# Patient Record
Sex: Female | Born: 1980
Health system: Southern US, Community
[De-identification: ages and names within clinical notes are randomized; demographics above are authoritative.]

## PROBLEM LIST (undated history)

## (undated) DIAGNOSIS — O1490 Unspecified pre-eclampsia, unspecified trimester: Secondary | ICD-10-CM

## (undated) DIAGNOSIS — H269 Unspecified cataract: Secondary | ICD-10-CM

## (undated) DIAGNOSIS — F419 Anxiety disorder, unspecified: Secondary | ICD-10-CM

## (undated) DIAGNOSIS — D649 Anemia, unspecified: Secondary | ICD-10-CM

## (undated) HISTORY — DX: Anemia, unspecified: D64.9

## (undated) HISTORY — DX: Unspecified cataract: H26.9

## (undated) HISTORY — DX: Anxiety disorder, unspecified: F41.9

## (undated) HISTORY — PX: CHOLECYSTECTOMY: SHX55

## (undated) HISTORY — DX: Unspecified pre-eclampsia, unspecified trimester: O14.90

---

## 2003-09-16 ENCOUNTER — Ambulatory Visit (HOSPITAL_COMMUNITY): Admission: RE | Admit: 2003-09-16 | Discharge: 2003-09-16 | Payer: Self-pay | Admitting: Obstetrics and Gynecology

## 2004-05-14 ENCOUNTER — Other Ambulatory Visit: Admission: RE | Admit: 2004-05-14 | Discharge: 2004-05-14 | Payer: Self-pay | Admitting: Obstetrics and Gynecology

## 2005-06-12 ENCOUNTER — Inpatient Hospital Stay (HOSPITAL_COMMUNITY): Admission: AD | Admit: 2005-06-12 | Discharge: 2005-06-15 | Payer: Self-pay | Admitting: Obstetrics and Gynecology

## 2005-06-20 ENCOUNTER — Inpatient Hospital Stay (HOSPITAL_COMMUNITY): Admission: AD | Admit: 2005-06-20 | Discharge: 2005-06-21 | Payer: Self-pay | Admitting: Obstetrics and Gynecology

## 2005-08-09 ENCOUNTER — Other Ambulatory Visit: Admission: RE | Admit: 2005-08-09 | Discharge: 2005-08-09 | Payer: Self-pay | Admitting: Obstetrics and Gynecology

## 2008-08-02 ENCOUNTER — Inpatient Hospital Stay (HOSPITAL_COMMUNITY): Admission: AD | Admit: 2008-08-02 | Discharge: 2008-08-02 | Payer: Self-pay | Admitting: Obstetrics and Gynecology

## 2008-08-17 ENCOUNTER — Inpatient Hospital Stay (HOSPITAL_COMMUNITY): Admission: RE | Admit: 2008-08-17 | Discharge: 2008-08-19 | Payer: Self-pay | Admitting: Obstetrics and Gynecology

## 2009-07-23 ENCOUNTER — Emergency Department (HOSPITAL_BASED_OUTPATIENT_CLINIC_OR_DEPARTMENT_OTHER): Admission: EM | Admit: 2009-07-23 | Discharge: 2009-07-23 | Payer: Self-pay | Admitting: Emergency Medicine

## 2009-07-23 ENCOUNTER — Ambulatory Visit: Payer: Self-pay | Admitting: Diagnostic Radiology

## 2010-05-20 LAB — CBC
HCT: 31.3 % — ABNORMAL LOW (ref 36.0–46.0)
HCT: 32.4 % — ABNORMAL LOW (ref 36.0–46.0)
Hemoglobin: 10.6 g/dL — ABNORMAL LOW (ref 12.0–15.0)
MCV: 89 fL (ref 78.0–100.0)
Platelets: 177 10*3/uL (ref 150–400)
RBC: 3.51 MIL/uL — ABNORMAL LOW (ref 3.87–5.11)
RBC: 3.69 MIL/uL — ABNORMAL LOW (ref 3.87–5.11)
RDW: 14 % (ref 11.5–15.5)
RDW: 14.1 % (ref 11.5–15.5)
WBC: 12.1 10*3/uL — ABNORMAL HIGH (ref 4.0–10.5)

## 2010-05-20 LAB — RPR: RPR Ser Ql: NONREACTIVE

## 2010-06-26 NOTE — Discharge Summary (Signed)
NAMEROGENE, METH           ACCOUNT NO.:  [id number]   MEDICAL RECORD NO.:  [id number]          PATIENT TYPE:  INP   LOCATION:  9139                          FACILITY:  [facility]   PHYSICIAN:  Huel Cote, M.D. DATE OF BIRTH:  [birth year]   DATE OF ADMISSION:  08/17/2008  DATE OF DISCHARGE:  08/19/2008                               DISCHARGE SUMMARY   DISCHARGE DIAGNOSES:  1. A 39 weeks, delivered.  2. Status post normal spontaneous vaginal delivery.   DISCHARGE MEDICATIONS:  Motrin 600 mg p.o. every 6 hours.   DISCHARGE FOLLOWUP:  The patient is to follow up in 6 weeks for her  routine postpartum exam.   HOSPITAL COURSE:  The patient is a 30 year old, G4, P1-0-2-1, who was  admitted at 55 weeks' gestation for possible LGA infant with a favorable  cervix for induction.  She had polyhydramnios earlier that resolved.  Prenatal care had been otherwise uneventful except for a positive GBS  culture.   Prenatal labs are as follows:  A positive, antibody negative, RPR  nonreactive, rubella equivocal, hepatitis B surface antigen negative,  HIV negative, GC negative, Chlamydia negative, group B strep positive,  one-hour Glucola 79.   PAST OBSTETRICAL HISTORY:  Two spontaneous miscarriages in May 2007.  She had a vacuum-assisted vaginal delivery of a 6-pound 15-ounce infant.   PAST GYN HISTORY:  None.   PAST SURGICAL HISTORY:  A wrist surgery for a fracture in 97.   PAST MEDICAL HISTORY:  None.   No allergies.   She is afebrile with stable vital signs.  Fetal heart rate was reactive  on admission.  Cervix was 52 and a minus 2 station.  Estimated fetal  weight was 8.5 pounds.  She was placed on penicillin for her group B  strep status and Pitocin, and several hours later had rupture of  membranes performed.  She continued to progress well, reached complete  dilation, and pushed great with a normal spontaneous vaginal delivery of  a vigorous female infant over a small  second-degree laceration.  Apgars  were 8 and 9, weight was 8 pounds 3 ounces.  There was a nuchal x1  reduced over head.  Small second-degree laceration was repaired with 2-0  Vicryl for hemostasis.  She was admitted for routine postpartum care.  Discharge hemoglobin was 10.6.  She did well, tolerated her pain with  p.o. Motrin, and was stable for discharge home on postpartum day #2.      Huel Cote, M.D.  Electronically Signed     KR/MEDQ  D:  08/19/2008  T:  08/19/2008  Job:  [id number]

## 2010-06-29 NOTE — Discharge Summary (Signed)
Chelsea Andersen, BRICKMAN           ACCOUNT NO.:  [id number]   MEDICAL RECORD NO.:  [id number]          PATIENT TYPE:  INP   LOCATION:  9314                          [facility]ILITY:  [facility]   PHYSICIAN:  Malachi Pro. Ambrose Mantle, M.D. 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  Prenatal care  complicated by spontaneous abortion x2 previously and fetal right  pyelectasis followed with serial ultrasounds and stable.  Blood group and  type A+.  Negative antibody.  RPR nonreactive.  Rubella non-immune.  Hepatitis B surface antigen negative.  HIV negative.  GC and chlamydia  negative.  Group B strep negative.  One-hour Glucola 101.   GYN HISTORY:  Negative.   MEDICAL HISTORY:  Negative.   SURGICAL HISTORY:  Left broken wrist in 1997.   No known allergies.  No medications.  On admission the patient was afebrile  with normal vital signs.  Fetal heart rate was reactive.  Heart was normal  sounds with no murmurs.  Lungs were clear.  Abdomen soft, nontender.  Cervix  1+ cm, 50%, vertex at a -2.  Patient was begun on Pitocin.  She began to get  uncomfortable at 1 p.m.  She was 2-3 cm, 80%.  Intrauterine pressure  catheter was placed as the Pitocin was at 18 milliunits a minute.  At 8 p.m.  the patient was comfortable with an epidural.  Cervix 3-4 cm, 80-90%.  An  intrauterine pressure catheter was replaced to ensure functioning well.  By  10 p.m. the cervix was 4-5 cm, 80%.  Dr. Senaida Ores advised her two more  hours to see if there was change.  By 1 a.m. the patient was feeling  increased pressure.  She was called to see  the patient because there had  been no change and Dr. Senaida Ores examined her and found her to be  completely dilated at a +1 station.  The patient pushed for approximately  two hours with descent of the vertex to +2 to +3 in the LOA position.  Variable decelerations were occurring with pushing with some delay in  recovery and fetal tachycardia was noted up to 180 with maternal temperature  of 101.5.  Patient was counseled regarding need to expedite delivery and she  agreed to a vacuum-assisted delivery.  The Kiwi self-held vacuum was placed  over a posterior fornix and the vertex delivered to crowning with series of  three pulls.  Patient then pushed out the remainder of the head and the  patient delivered a living female infant 6 pounds 15 ounces with Apgars of 7  at one and 9 at five minutes.  Placenta delivered spontaneously.  Small  second-degree laceration was noted and repaired with 2-0 Vicryl.  Cervix and  rectum  were intact.  Blood loss about 400 mL.  Baby did quite well.  Postpartum the patient did well and was discharged on the second postpartum  day.  Initial hemoglobin 11.9, hematocrit 35.2, white count 12,600, platelet  count 260,000.  Follow-up hemoglobin 10.5.  RPR was nonreactive.   FINAL DIAGNOSES:  1.  Intrauterine pregnancy 39+ weeks delivered left occiput anterior.  2.  Prolonged second stage of labor.   OPERATION:  1.  Vacuum-assisted vaginal delivery.  2.  Second-degree perineal laceration repair.   FINAL CONDITION:  Improved.   INSTRUCTIONS:  Our regular discharge instruction booklet.  Patient is  advised to use Percocet 5/325 24 tablets one every four to six hours as  needed for pain, to get the rubella vaccine prior to discharge, and return  in six weeks for follow-up examination.      Malachi Pro. Ambrose Mantle, M.D.  Electronically Signed     TFH/MEDQ  D:  06/15/2005  T:  06/17/2005  Job:  [id number]

## 2014-05-16 ENCOUNTER — Ambulatory Visit
Admission: RE | Admit: 2014-05-16 | Discharge: 2014-05-16 | Disposition: A | Discharge status: Home / Self Care | Payer: BLUE CROSS/BLUE SHIELD | Source: Ambulatory Visit | Attending: Family Medicine | Admitting: Family Medicine

## 2014-05-16 ENCOUNTER — Encounter (INDEPENDENT_AMBULATORY_CARE_PROVIDER_SITE_OTHER): Payer: Self-pay

## 2014-05-16 ENCOUNTER — Other Ambulatory Visit: Payer: Self-pay | Admitting: Family Medicine

## 2014-05-16 DIAGNOSIS — M25511 Pain in right shoulder: Secondary | ICD-10-CM

## 2014-07-01 ENCOUNTER — Other Ambulatory Visit: Payer: Self-pay | Admitting: Family Medicine

## 2014-07-01 DIAGNOSIS — M25511 Pain in right shoulder: Secondary | ICD-10-CM

## 2014-07-01 DIAGNOSIS — M5410 Radiculopathy, site unspecified: Secondary | ICD-10-CM

## 2014-07-17 ENCOUNTER — Ambulatory Visit
Admission: RE | Admit: 2014-07-17 | Discharge: 2014-07-17 | Disposition: A | Discharge status: Home / Self Care | Payer: Self-pay | Source: Ambulatory Visit | Attending: Family Medicine | Admitting: Family Medicine

## 2014-07-17 DIAGNOSIS — M25511 Pain in right shoulder: Secondary | ICD-10-CM

## 2014-07-17 DIAGNOSIS — M5410 Radiculopathy, site unspecified: Secondary | ICD-10-CM

## 2014-08-03 ENCOUNTER — Ambulatory Visit (INDEPENDENT_AMBULATORY_CARE_PROVIDER_SITE_OTHER): Payer: BLUE CROSS/BLUE SHIELD | Admitting: Diagnostic Neuroimaging

## 2014-08-03 ENCOUNTER — Encounter: Payer: Self-pay | Admitting: *Deleted

## 2014-08-03 VITALS — BP 111/77 | HR 71 | Ht 64.0 in | Wt 154.4 lb

## 2014-08-03 DIAGNOSIS — T148XXA Other injury of unspecified body region, initial encounter: Secondary | ICD-10-CM

## 2014-08-03 DIAGNOSIS — M7918 Myalgia, other site: Secondary | ICD-10-CM

## 2014-08-03 DIAGNOSIS — T148 Other injury of unspecified body region: Secondary | ICD-10-CM

## 2014-08-03 DIAGNOSIS — M792 Neuralgia and neuritis, unspecified: Secondary | ICD-10-CM | POA: Diagnosis not present

## 2014-08-03 DIAGNOSIS — M791 Myalgia: Secondary | ICD-10-CM | POA: Diagnosis not present
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
## 2015-07-03 DIAGNOSIS — Z30430 Encounter for insertion of intrauterine contraceptive device: Secondary | ICD-10-CM | POA: Diagnosis not present

## 2015-07-11 ENCOUNTER — Other Ambulatory Visit (HOSPITAL_BASED_OUTPATIENT_CLINIC_OR_DEPARTMENT_OTHER): Payer: Self-pay | Admitting: Family Medicine

## 2015-07-11 DIAGNOSIS — R1084 Generalized abdominal pain: Secondary | ICD-10-CM

## 2015-07-11 DIAGNOSIS — R1011 Right upper quadrant pain: Secondary | ICD-10-CM | POA: Diagnosis not present

## 2015-07-12 ENCOUNTER — Ambulatory Visit (HOSPITAL_BASED_OUTPATIENT_CLINIC_OR_DEPARTMENT_OTHER)
Admission: RE | Admit: 2015-07-12 | Discharge: 2015-07-12 | Disposition: A | Discharge status: Home / Self Care | Payer: BLUE CROSS/BLUE SHIELD | Source: Ambulatory Visit | Attending: Family Medicine | Admitting: Family Medicine

## 2015-07-12 DIAGNOSIS — R1084 Generalized abdominal pain: Secondary | ICD-10-CM | POA: Diagnosis not present

## 2015-07-12 DIAGNOSIS — K8020 Calculus of gallbladder without cholecystitis without obstruction: Secondary | ICD-10-CM | POA: Insufficient documentation

## 2015-07-14 DIAGNOSIS — H269 Unspecified cataract: Secondary | ICD-10-CM | POA: Insufficient documentation

## 2015-07-17 DIAGNOSIS — K8001 Calculus of gallbladder with acute cholecystitis with obstruction: Secondary | ICD-10-CM | POA: Diagnosis not present

## 2015-07-17 DIAGNOSIS — Z6826 Body mass index (BMI) 26.0-26.9, adult: Secondary | ICD-10-CM | POA: Diagnosis not present

## 2015-07-26 DIAGNOSIS — Z30431 Encounter for routine checking of intrauterine contraceptive device: Secondary | ICD-10-CM | POA: Diagnosis not present

## 2015-07-28 DIAGNOSIS — K8010 Calculus of gallbladder with chronic cholecystitis without obstruction: Secondary | ICD-10-CM | POA: Diagnosis not present

## 2015-07-28 DIAGNOSIS — K8001 Calculus of gallbladder with acute cholecystitis with obstruction: Secondary | ICD-10-CM | POA: Diagnosis not present

## 2015-07-28 DIAGNOSIS — K8020 Calculus of gallbladder without cholecystitis without obstruction: Secondary | ICD-10-CM | POA: Diagnosis not present

## 2015-12-16 DIAGNOSIS — N3001 Acute cystitis with hematuria: Secondary | ICD-10-CM | POA: Diagnosis not present

## 2016-01-12 DIAGNOSIS — L299 Pruritus, unspecified: Secondary | ICD-10-CM | POA: Diagnosis not present

## 2016-01-12 DIAGNOSIS — L2084 Intrinsic (allergic) eczema: Secondary | ICD-10-CM | POA: Diagnosis not present

## 2016-01-12 DIAGNOSIS — Z809 Family history of malignant neoplasm, unspecified: Secondary | ICD-10-CM | POA: Diagnosis not present

## 2016-01-30 DIAGNOSIS — J3089 Other allergic rhinitis: Secondary | ICD-10-CM | POA: Diagnosis not present

## 2016-01-30 DIAGNOSIS — H1013 Acute atopic conjunctivitis, bilateral: Secondary | ICD-10-CM | POA: Diagnosis not present

## 2016-01-30 DIAGNOSIS — J3081 Allergic rhinitis due to animal (cat) (dog) hair and dander: Secondary | ICD-10-CM | POA: Diagnosis not present

## 2016-02-01 DIAGNOSIS — J3081 Allergic rhinitis due to animal (cat) (dog) hair and dander: Secondary | ICD-10-CM | POA: Insufficient documentation

## 2016-03-08 DIAGNOSIS — L281 Prurigo nodularis: Secondary | ICD-10-CM | POA: Diagnosis not present

## 2016-03-08 DIAGNOSIS — Z809 Family history of malignant neoplasm, unspecified: Secondary | ICD-10-CM | POA: Diagnosis not present

## 2016-03-08 DIAGNOSIS — L578 Other skin changes due to chronic exposure to nonionizing radiation: Secondary | ICD-10-CM | POA: Diagnosis not present

## 2016-04-10 DIAGNOSIS — E663 Overweight: Secondary | ICD-10-CM | POA: Diagnosis not present

## 2016-04-10 DIAGNOSIS — Z713 Dietary counseling and surveillance: Secondary | ICD-10-CM | POA: Diagnosis not present
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
## 2016-07-10 DIAGNOSIS — Z713 Dietary counseling and surveillance: Secondary | ICD-10-CM | POA: Diagnosis not present

## 2016-11-07 DIAGNOSIS — R5383 Other fatigue: Secondary | ICD-10-CM | POA: Diagnosis not present

## 2016-11-07 DIAGNOSIS — Z130 Encounter for screening for diseases of the blood and blood-forming organs and certain disorders involving the immune mechanism: Secondary | ICD-10-CM | POA: Diagnosis not present

## 2016-11-07 DIAGNOSIS — Z6829 Body mass index (BMI) 29.0-29.9, adult: Secondary | ICD-10-CM | POA: Diagnosis not present

## 2016-11-07 DIAGNOSIS — Z01419 Encounter for gynecological examination (general) (routine) without abnormal findings: Secondary | ICD-10-CM | POA: Diagnosis not present

## 2016-11-07 DIAGNOSIS — Z1389 Encounter for screening for other disorder: Secondary | ICD-10-CM | POA: Diagnosis not present

## 2016-11-19 DIAGNOSIS — F411 Generalized anxiety disorder: Secondary | ICD-10-CM | POA: Diagnosis not present

## 2016-11-19 DIAGNOSIS — F410 Panic disorder [episodic paroxysmal anxiety] without agoraphobia: Secondary | ICD-10-CM | POA: Diagnosis not present

## 2016-11-26 DIAGNOSIS — F410 Panic disorder [episodic paroxysmal anxiety] without agoraphobia: Secondary | ICD-10-CM | POA: Diagnosis not present

## 2016-12-09 DIAGNOSIS — F410 Panic disorder [episodic paroxysmal anxiety] without agoraphobia: Secondary | ICD-10-CM | POA: Diagnosis not present

## 2016-12-13 DIAGNOSIS — F411 Generalized anxiety disorder: Secondary | ICD-10-CM | POA: Diagnosis not present

## 2016-12-13 DIAGNOSIS — F410 Panic disorder [episodic paroxysmal anxiety] without agoraphobia: Secondary | ICD-10-CM | POA: Diagnosis not present

## 2016-12-23 DIAGNOSIS — F410 Panic disorder [episodic paroxysmal anxiety] without agoraphobia: Secondary | ICD-10-CM | POA: Diagnosis not present

## 2017-01-27 DIAGNOSIS — F410 Panic disorder [episodic paroxysmal anxiety] without agoraphobia: Secondary | ICD-10-CM | POA: Diagnosis not present

## 2017-02-19 DIAGNOSIS — F410 Panic disorder [episodic paroxysmal anxiety] without agoraphobia: Secondary | ICD-10-CM | POA: Diagnosis not present

## 2017-03-03 DIAGNOSIS — F410 Panic disorder [episodic paroxysmal anxiety] without agoraphobia: Secondary | ICD-10-CM | POA: Diagnosis not present

## 2017-03-24 DIAGNOSIS — F410 Panic disorder [episodic paroxysmal anxiety] without agoraphobia: Secondary | ICD-10-CM | POA: Diagnosis not present

## 2017-04-07 DIAGNOSIS — F410 Panic disorder [episodic paroxysmal anxiety] without agoraphobia: Secondary | ICD-10-CM | POA: Diagnosis not present

## 2018-05-09 IMAGING — US US ABDOMEN COMPLETE
1 series · 14 of 25 positions shown · non-contrast
Comparison: Ultrasound 09/16/2003.

CLINICAL DATA: Right upper quadrant pain .

EXAM:
ABDOMEN ULTRASOUND COMPLETE

[Series 1: us abdomen complete · 0.15mm/px · 14 of 81 slices shown]
[im 1/81]
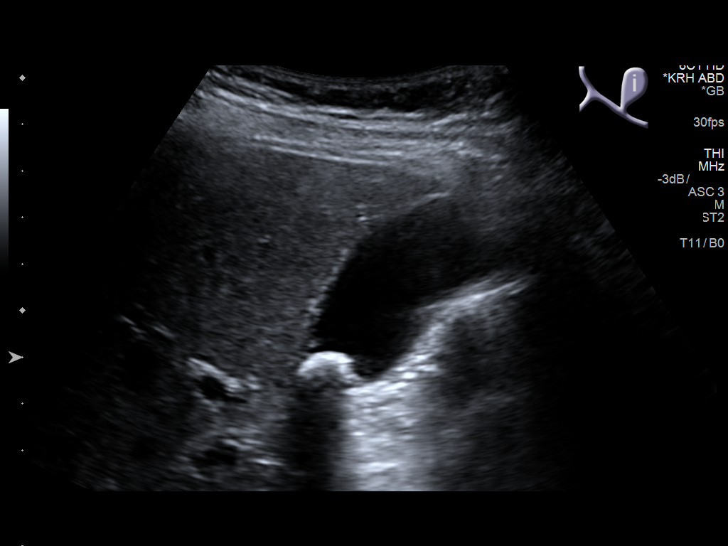
[im 7/81]
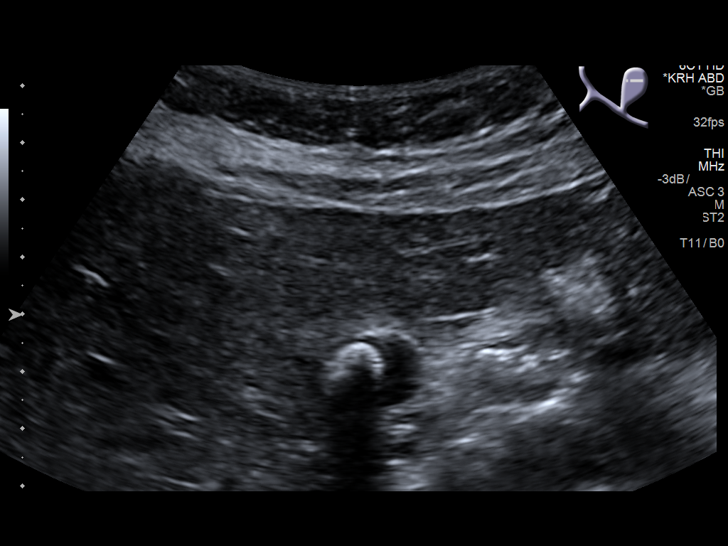
[im 14/81]
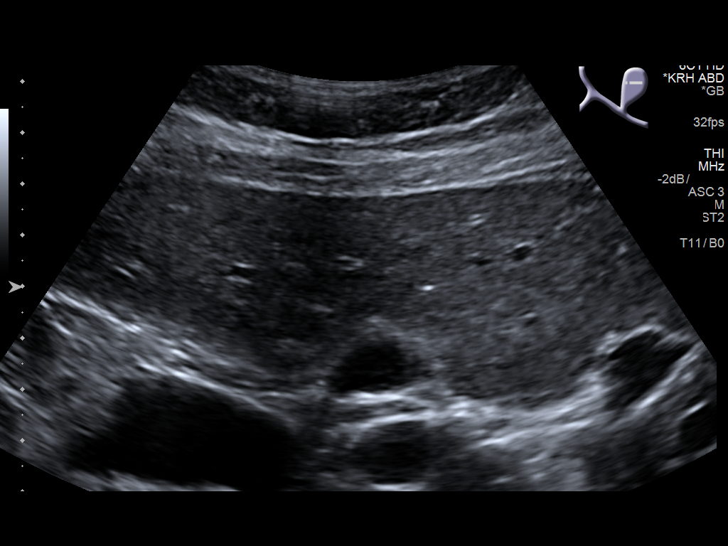
[im 21/81]
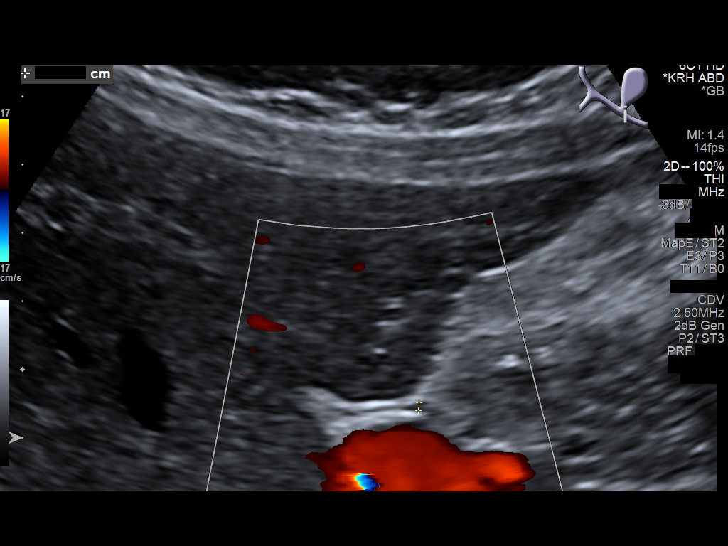
[im 27/81]
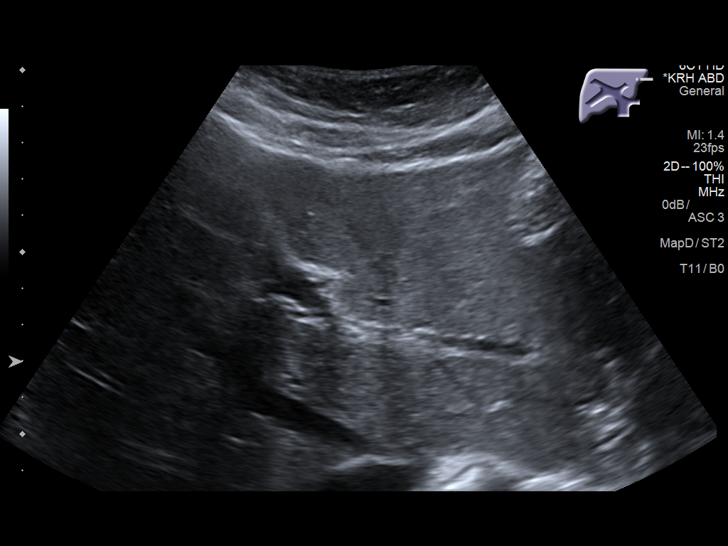
[im 31/81]
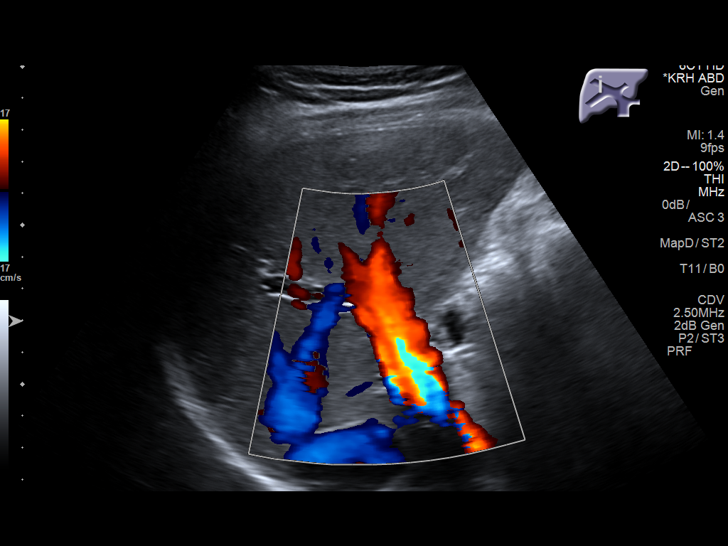
[im 37/81]
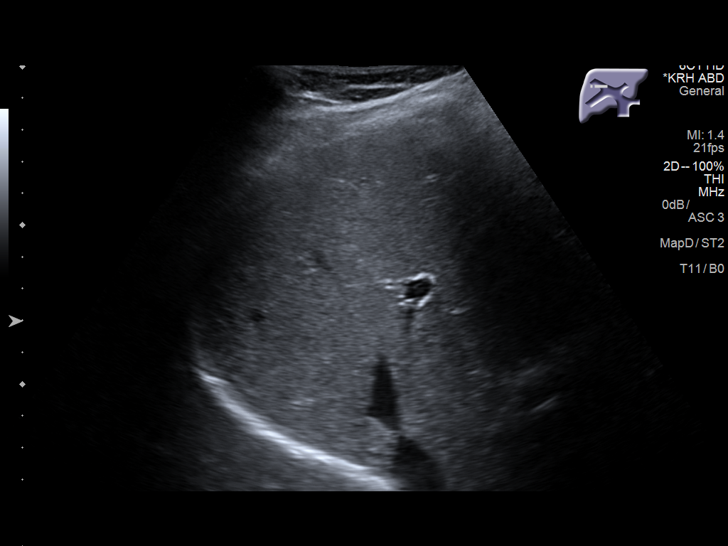
[im 44/81]
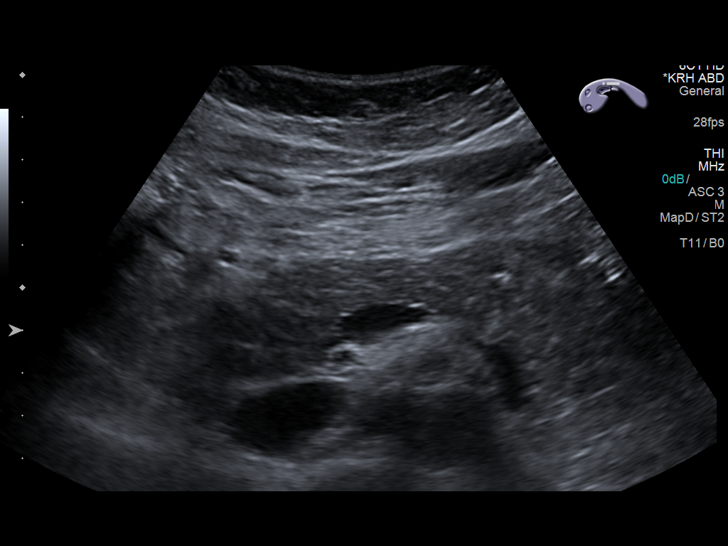
[im 51/81]
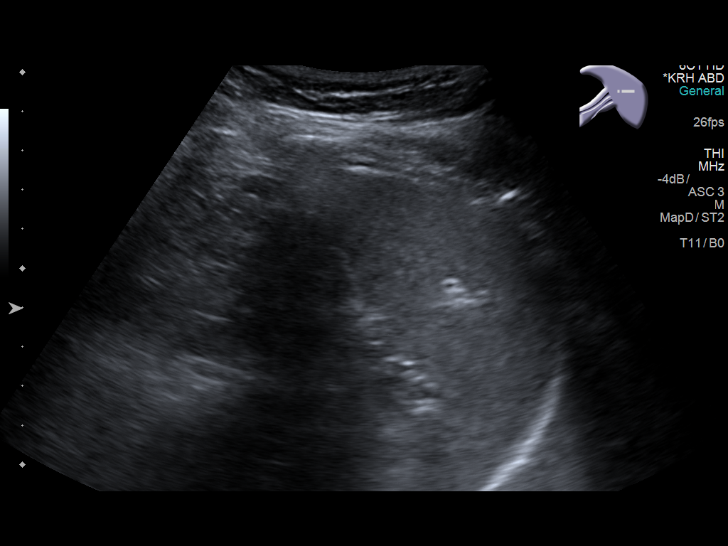
[im 54/81]
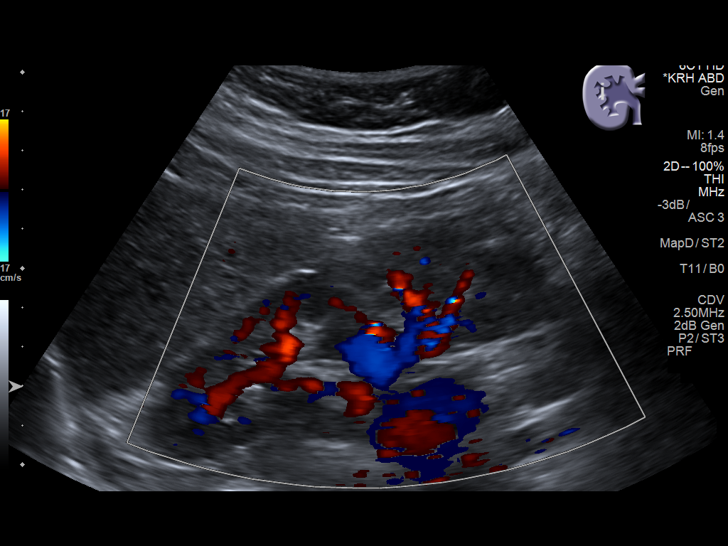
[im 61/81]
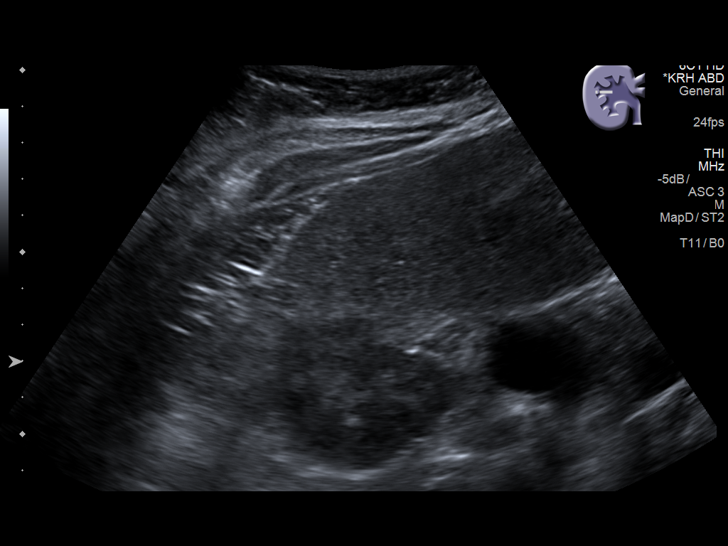
[im 67/81]
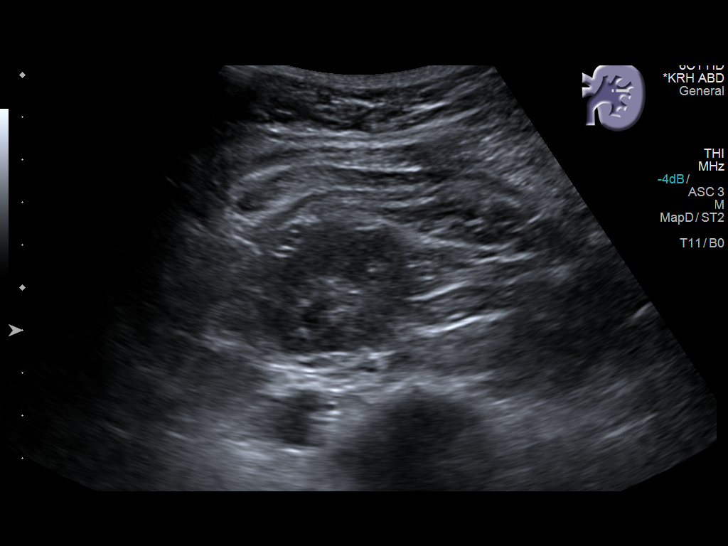
[im 74/81]
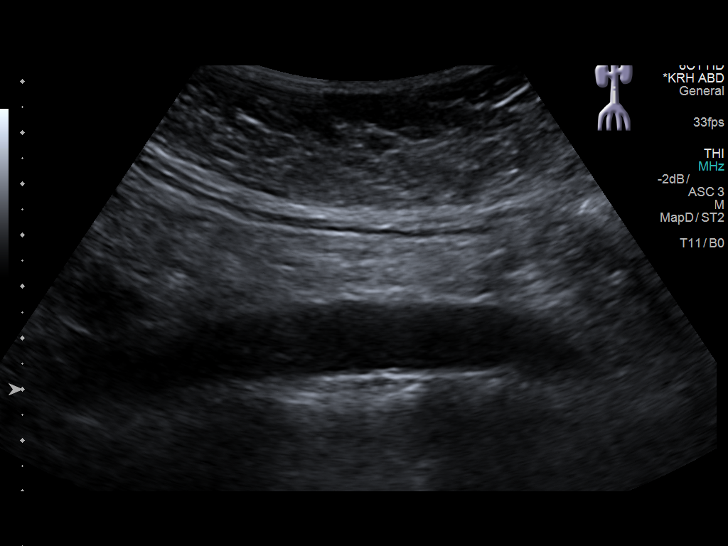
[im 81/81]
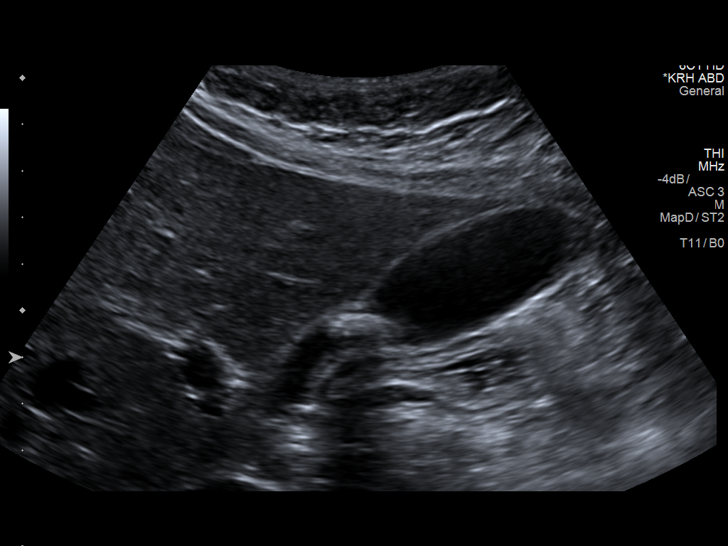

[14 of 25 positions shown; findings below may reference images not displayed]

FINDINGS: Gallbladder: 1.7 cm in mobile gallstone in the neck of the
gallbladder. Gallbladder wall thickness normal at 2 mm. Positive
Murphy sign.

Common bile duct: Diameter: 1.0 mm

Liver: No focal lesion identified. Within normal limits in
parenchymal echogenicity.

IVC: No abnormality visualized.

Pancreas: Visualized portion unremarkable.

Spleen: Size and appearance within normal limits.

Right Kidney: Length: 12.0 cm. Echogenicity within normal limits. No
mass or hydronephrosis visualized.

Left Kidney: Length: 12.7 cm. Echogenicity within normal limits. No
mass or hydronephrosis visualized.

Abdominal aorta: No aneurysm visualized.

Other findings: None.
IMPRESSION: 1.7 cm mobile gallstone in the neck of the gallbladder. Gallbladder
wall thickness normal. No pathologic pericholecystic fluid
collections noted. Murphy sign was positive. No biliary distention.

## 2019-01-16 ENCOUNTER — Emergency Department (HOSPITAL_BASED_OUTPATIENT_CLINIC_OR_DEPARTMENT_OTHER)
Admission: EM | Admit: 2019-01-16 | Discharge: 2019-01-17 | Disposition: A | Discharge status: Home / Self Care | Payer: BC Managed Care – PPO | Attending: Emergency Medicine | Admitting: Emergency Medicine

## 2019-01-16 ENCOUNTER — Encounter (HOSPITAL_BASED_OUTPATIENT_CLINIC_OR_DEPARTMENT_OTHER): Payer: Self-pay | Admitting: Emergency Medicine

## 2019-01-16 ENCOUNTER — Other Ambulatory Visit: Payer: Self-pay

## 2019-01-16 DIAGNOSIS — T7840XA Allergy, unspecified, initial encounter: Secondary | ICD-10-CM | POA: Diagnosis not present

## 2019-01-16 DIAGNOSIS — R600 Localized edema: Secondary | ICD-10-CM | POA: Diagnosis present

## 2019-01-16 DIAGNOSIS — Z87891 Personal history of nicotine dependence: Secondary | ICD-10-CM | POA: Diagnosis not present

## 2019-01-16 MED ORDER — DIPHENHYDRAMINE HCL 50 MG/ML IJ SOLN
25.0000 mg | Freq: Once | INTRAMUSCULAR | Status: AC
  Administered 2019-01-16: 25 mg via INTRAVENOUS
  Filled 2019-01-16: qty 1

## 2019-01-16 MED ORDER — FLUORESCEIN SODIUM 1 MG OP STRP
ORAL_STRIP | OPHTHALMIC | Status: AC
  Filled 2019-01-16: qty 1

## 2019-01-16 MED ORDER — TETRACAINE HCL 0.5 % OP SOLN
OPHTHALMIC | Status: AC
  Filled 2019-01-16: qty 4

## 2019-01-16 MED ORDER — DEXAMETHASONE SODIUM PHOSPHATE 10 MG/ML IJ SOLN
10.0000 mg | Freq: Once | INTRAMUSCULAR | Status: AC
  Administered 2019-01-16: 10 mg via INTRAVENOUS
  Filled 2019-01-16: qty 1

## 2019-01-16 MED ORDER — OXYMETAZOLINE HCL 0.05 % NA SOLN
1.0000 | Freq: Once | NASAL | Status: AC
  Administered 2019-01-16: 1 via NASAL
  Filled 2019-01-16: qty 30

## 2019-01-16 NOTE — ED Triage Notes (Signed)
Patient states that she started to have nasal congestion and right eyes swelling with some watery eyes about 1 hour ago  - denies any noted allergies. Swelling noted to her right eye. Patient denies any SOB and / or trouble swallowing

## 2019-01-16 NOTE — ED Notes (Signed)
Pt states symptoms are improving.
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
## 2019-01-17 MED ORDER — EPINEPHRINE 0.3 MG/0.3ML IJ SOAJ: 0.3000 mg | INTRAMUSCULAR | 0 refills | Status: AC | PRN

## 2019-01-17 NOTE — ED Notes (Signed)
Removed 20G IV in R AC, catheter intact, gauze dressing applied.

## 2019-02-02 DIAGNOSIS — F411 Generalized anxiety disorder: Secondary | ICD-10-CM | POA: Diagnosis not present

## 2019-02-02 DIAGNOSIS — T7840XD Allergy, unspecified, subsequent encounter: Secondary | ICD-10-CM | POA: Diagnosis not present

## 2019-02-02 DIAGNOSIS — F410 Panic disorder [episodic paroxysmal anxiety] without agoraphobia: Secondary | ICD-10-CM | POA: Diagnosis not present

## 2019-04-12 DIAGNOSIS — Z130 Encounter for screening for diseases of the blood and blood-forming organs and certain disorders involving the immune mechanism: Secondary | ICD-10-CM | POA: Diagnosis not present

## 2019-04-12 DIAGNOSIS — Z6832 Body mass index (BMI) 32.0-32.9, adult: Secondary | ICD-10-CM | POA: Diagnosis not present

## 2019-04-12 DIAGNOSIS — Z124 Encounter for screening for malignant neoplasm of cervix: Secondary | ICD-10-CM | POA: Diagnosis not present

## 2019-04-12 DIAGNOSIS — Z1151 Encounter for screening for human papillomavirus (HPV): Secondary | ICD-10-CM | POA: Diagnosis not present

## 2019-04-12 DIAGNOSIS — Z01419 Encounter for gynecological examination (general) (routine) without abnormal findings: Secondary | ICD-10-CM | POA: Diagnosis not present

## 2019-04-12 DIAGNOSIS — Z1231 Encounter for screening mammogram for malignant neoplasm of breast: Secondary | ICD-10-CM | POA: Diagnosis not present

## 2020-12-06 DIAGNOSIS — F411 Generalized anxiety disorder: Secondary | ICD-10-CM | POA: Diagnosis not present

## 2021-02-27 ENCOUNTER — Other Ambulatory Visit (HOSPITAL_BASED_OUTPATIENT_CLINIC_OR_DEPARTMENT_OTHER): Payer: Self-pay

## 2021-02-27 MED ORDER — FLUOXETINE HCL 10 MG PO CAPS
ORAL_CAPSULE | ORAL | 0 refills | Status: DC
  Filled 2021-02-27 – 2021-03-27 (×2): qty 90, 90d supply, fill #0

## 2021-03-07 ENCOUNTER — Other Ambulatory Visit (HOSPITAL_BASED_OUTPATIENT_CLINIC_OR_DEPARTMENT_OTHER): Payer: Self-pay

## 2021-03-27 ENCOUNTER — Other Ambulatory Visit (HOSPITAL_BASED_OUTPATIENT_CLINIC_OR_DEPARTMENT_OTHER): Payer: Self-pay

## 2021-10-08 DIAGNOSIS — Z30431 Encounter for routine checking of intrauterine contraceptive device: Secondary | ICD-10-CM | POA: Diagnosis not present

## 2021-10-08 DIAGNOSIS — Z01419 Encounter for gynecological examination (general) (routine) without abnormal findings: Secondary | ICD-10-CM | POA: Diagnosis not present

## 2021-10-08 DIAGNOSIS — Z1231 Encounter for screening mammogram for malignant neoplasm of breast: Secondary | ICD-10-CM | POA: Diagnosis not present

## 2021-10-08 DIAGNOSIS — Z130 Encounter for screening for diseases of the blood and blood-forming organs and certain disorders involving the immune mechanism: Secondary | ICD-10-CM | POA: Diagnosis not present

## 2021-10-08 DIAGNOSIS — Z1389 Encounter for screening for other disorder: Secondary | ICD-10-CM | POA: Diagnosis not present

## 2022-03-20 ENCOUNTER — Ambulatory Visit: Payer: 59 | Admitting: Family Medicine

## 2022-03-27 ENCOUNTER — Ambulatory Visit: Payer: 59 | Admitting: Family Medicine

## 2022-03-27 ENCOUNTER — Encounter: Payer: Self-pay | Admitting: Family Medicine

## 2022-03-27 VITALS — BP 129/70 | HR 109 | Temp 98.2°F | Resp 16 | Ht 65.0 in | Wt 188.0 lb

## 2022-03-27 DIAGNOSIS — R635 Abnormal weight gain: Secondary | ICD-10-CM

## 2022-03-27 DIAGNOSIS — F419 Anxiety disorder, unspecified: Secondary | ICD-10-CM

## 2022-03-27 DIAGNOSIS — D649 Anemia, unspecified: Secondary | ICD-10-CM | POA: Insufficient documentation

## 2022-03-27 DIAGNOSIS — Z975 Presence of (intrauterine) contraceptive device: Secondary | ICD-10-CM | POA: Diagnosis not present

## 2022-03-27 DIAGNOSIS — Z6831 Body mass index (BMI) 31.0-31.9, adult: Secondary | ICD-10-CM

## 2022-03-27 NOTE — Patient Instructions (Signed)
Thank you for choosing [facility] at [facility] for your Primary Care needs. I am excited for the opportunity to partner with you to meet your health care goals. It was a pleasure meeting you today!  Information on diet, exercise, and health maintenance recommendations are listed below. This is information to help you be sure you are on track for optimal health and monitoring.   Please look over this and let us know if you have any questions or if you have completed any of the health maintenance outside of [place] so that we can be sure your records are up to date.  ___________________________________________________________  MyChart:  For all urgent or time sensitive needs we ask that you please call the office to avoid delays. Our number is [phone]. MyChart is not constantly monitored and due to the large volume of messages a day, replies may take up to 72 business hours.  MyChart Policy: MyChart allows for you to see your visit notes, after visit summary, provider recommendations, lab and tests results, make an appointment, request refills, and contact your provider or the office for non-urgent questions or concerns. Providers are seeing patients during normal business hours and do not have built in time to review MyChart messages.  We ask that you allow a minimum of 3 business days for responses to [employer]. For this reason, please do not send urgent requests through [place]. Please call the office at [phone]. New and ongoing conditions may require a visit. We have virtual and in-person visits available for your convenience.  Complex MyChart concerns may require a visit. Your provider may request you schedule a virtual or in-person visit to ensure we are providing the best care possible. MyChart messages sent after 11:00 AM on Friday will not be received by the provider until Monday morning.    Lab and Test Results: You will receive your lab and test  results on MyChart as soon as they are completed and results have been sent by the lab or testing facility. Due to this service, you will receive your results BEFORE your provider.  I review lab and test results each morning prior to seeing patients. Some results require collaboration with other providers to ensure you are receiving the most appropriate care. For this reason, we ask that you please allow a minimum of 3-5 business days from the time that ALL results have been received for your provider to receive and review lab and test results and contact you about these.  Most lab and test result comments from the provider will be sent through [facility]. Your provider may recommend changes to the plan of care, follow-up visits, repeat testing, ask questions, or request an office visit to discuss these results. You may reply directly to this message or call the office to provide information for the provider or set up an appointment. In some instances, you will be called with test results and recommendations. Please let us know if this is preferred and we will make note of this in your chart to provide this for you.    If you have not heard a response to your lab or test results in 5 business days from all results returning to [place], please call the office to let us know. We ask that you please avoid calling prior to this time unless there is an emergent concern. Due to high call volumes, this can delay the resulting process.  After Hours: For all non-emergency after hours needs, please  call the office at [phone] and select the option to reach the on-call  service. On-call services are shared between multiple [place] offices and therefore it will not be possible to speak directly with your provider. On-call providers may provide medical advice and recommendations, but are unable to provide refills for maintenance medications.  For all emergency or urgent medical needs after normal business hours, we  recommend that you seek care at the closest Urgent Care or Emergency Department to ensure appropriate treatment in a timely manner.  [facility] has a 24 hour emergency room located on the ground floor for your convenience.   Urgent Concerns During the Business Day Providers are seeing patients from 8AM to [place] with a busy schedule and are most often not able to respond to non-urgent calls until the end of the day or the next business day. If you should have URGENT concerns during the day, please call and speak to the nurse or schedule a same day appointment so that we can address your concern without delay.   Thank you, again, for choosing me as your health care partner. I appreciate your trust and look forward to learning more about you!   Purcell Nails Olevia Bowens, DNP, FNP-C  ___________________________________________________________  Health Maintenance Recommendations Screening Testing Mammogram Every 1-2 years based on history and risk factors Starting at age 22 Pap Smear Ages 21-39 every 3 years Ages 86-65 every 5 years with HPV testing More frequent testing may be required based on results and history Colon Cancer Screening Every 1-10 years based on test performed, risk factors, and history Starting at age 24 Bone Density Screening Every 2-10 years based on history Starting at age 26 for women Recommendations for men differ based on medication usage, history, and risk factors AAA Screening One time ultrasound Men 69-77 years old who have ever smoked Lung Cancer Screening Low Dose Lung CT every 12 months Age 79-80 years with a 20 pack-year smoking history who still smoke or who have quit within the last 15 years  Screening Labs Routine  Labs: Complete Blood Count (CBC), Complete Metabolic Panel (CMP), Cholesterol (Lipid Panel) Every 6-12 months based on history and medications May be recommended more frequently based on current conditions or previous results Hemoglobin  A1c Lab Every 3-12 months based on history and previous results Starting at age 60 or earlier with diagnosis of diabetes, high cholesterol, BMI >26, and/or risk factors Frequent monitoring for patients with diabetes to ensure blood sugar control Thyroid Panel  Every 6 months based on history, symptoms, and risk factors May be repeated more often if on medication HIV One time testing for all patients 60 and older May be repeated more frequently for patients with increased risk factors or exposure Hepatitis C One time testing for all patients 52 and older May be repeated more frequently for patients with increased risk factors or exposure Gonorrhea, Chlamydia Every 12 months for all sexually active persons 13-24 years Additional monitoring may be recommended for those who are considered high risk or who have symptoms PSA Men 36-89 years old with risk factors Additional screening may be recommended from age 88-69 based on risk factors, symptoms, and history  Vaccine Recommendations Tetanus Booster All adults every 10 years Flu Vaccine All patients 6 months and older every year COVID Vaccine All patients 12 years and older Initial dosing with booster May recommend additional booster based on age and health history HPV Vaccine 2 doses all patients age 25-26 Dosing may be considered  for patients over 26 Shingles Vaccine (Shingrix) 2 doses all adults 12 years and older Pneumonia (Pneumovax 23) All adults 87 years and older May recommend earlier dosing based on health history Pneumonia (Prevnar 74) All adults 24 years and older Dosed 1 year after Pneumovax 23 Pneumonia (Prevnar 79) All adults 66 years and older (adults [id number] with certain conditions or risk factors) 1 dose  For those who have not received Prevnar 13 vaccine previously   Additional Screening, Testing, and Vaccinations may be recommended on an individualized basis based on family history, health history, risk  factors, and/or exposure.  __________________________________________________________  Diet Recommendations for All Patients  I recommend that all patients maintain a diet low in saturated fats, carbohydrates, and cholesterol. While this can be challenging at first, it is not impossible and small changes can make big differences.  Things to try: Decreasing the amount of soda, sweet tea, and/or juice to one or less per day and replace with water While water is always the first choice, if you do not like water you may consider adding a water additive without sugar to improve the taste other sugar free drinks Replace potatoes with a brightly colored vegetable  Use healthy oils, such as canola oil or olive oil, instead of butter or hard margarine Limit your bread intake to two pieces or less a day Replace regular pasta with low carb pasta options Bake, broil, or grill foods instead of frying Monitor portion sizes  Eat smaller, more frequent meals throughout the day instead of large meals  An important thing to remember is, if you love foods that are not great for your health, you don't have to give them up completely. Instead, allow these foods to be a reward when you have done well. Allowing yourself to still have special treats every once in a while is a nice way to tell yourself thank you for working hard to keep yourself healthy.   Also remember that every day is a new day. If you have a bad day and "fall off the wagon", you can still climb right back up and keep moving along on your journey!  We have resources available to help you!  Some websites that may be helpful include: www.http://carter.biz/  Www.VeryWellFit.com _____________________________________________________________  Activity Recommendations for All Patients  I recommend that all adults get at least 20 minutes of moderate physical activity that elevates your heart rate at least 5 days out of the week.  Some examples  include: Walking or jogging at a pace that allows you to carry on a conversation Cycling (stationary bike or outdoors) Water aerobics Yoga Weight lifting Dancing If physical limitations prevent you from putting stress on your joints, exercise in a pool or seated in a chair are excellent options.  Do determine your MAXIMUM heart rate for activity: 220 - YOUR AGE = MAX Heart Rate   Remember! Do not push yourself too hard.  Start slowly and build up your pace, speed, weight, time in exercise, etc.  Allow your body to rest between exercise and get good sleep. You will need more water than normal when you are exerting yourself. Do not wait until you are thirsty to drink. Drink with a purpose of getting in at least 8, 8 ounce glasses of water a day plus more depending on how much you exercise and sweat.    If you begin to develop dizziness, chest pain, abdominal pain, jaw pain, shortness of breath, headache, vision changes, lightheadedness, or other concerning symptoms,  stop the activity and allow your body to rest. If your symptoms are severe, seek emergency evaluation immediately. If your symptoms are concerning, but not severe, please let us know so that we can recommend further evaluation.
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
## 2022-04-04 ENCOUNTER — Other Ambulatory Visit: Payer: 59

## 2022-04-12 ENCOUNTER — Other Ambulatory Visit: Payer: 59

## 2022-04-13 ENCOUNTER — Ambulatory Visit
Admission: EM | Admit: 2022-04-13 | Discharge: 2022-04-13 | Disposition: A | Discharge status: Home / Self Care | Payer: 59 | Attending: Urgent Care | Admitting: Urgent Care

## 2022-04-13 DIAGNOSIS — J309 Allergic rhinitis, unspecified: Secondary | ICD-10-CM

## 2022-04-13 DIAGNOSIS — Z1152 Encounter for screening for COVID-19: Secondary | ICD-10-CM | POA: Insufficient documentation

## 2022-04-13 DIAGNOSIS — R000 Tachycardia, unspecified: Secondary | ICD-10-CM | POA: Diagnosis not present

## 2022-04-13 DIAGNOSIS — B349 Viral infection, unspecified: Secondary | ICD-10-CM

## 2022-04-13 MED ORDER — IPRATROPIUM BROMIDE 0.03 % NA SOLN: 2.0000 | Freq: Two times a day (BID) | NASAL | 0 refills | Status: DC

## 2022-04-13 MED ORDER — PROMETHAZINE-DM 6.25-15 MG/5ML PO SYRP: 5.0000 mL | ORAL_SOLUTION | Freq: Three times a day (TID) | ORAL | 0 refills | Status: DC | PRN

## 2022-04-13 NOTE — ED Triage Notes (Signed)
Pt c/o prod cough, head/chest congestion x 3 days-low grade fever today-NAD-steady gait

## 2022-04-13 NOTE — Discharge Instructions (Addendum)
We will notify you of your test results as they arrive and may take between about 24 hours.  I encourage you to sign up for MyChart if you have not already done so as this can be the easiest way for [place] to communicate results to you online or through a phone app.  Generally, we only contact you if it is a positive test result.  In the meantime, if you develop worsening symptoms including fever, chest pain, shortness of breath despite our current treatment plan then please report to the emergency room as this may be a sign of worsening status from possible viral infection.  Otherwise, we will manage this as a viral syndrome. For sore throat or cough try using a honey-based tea. Use 3 teaspoons of honey with juice squeezed from half lemon. Place shaved pieces of ginger into 1/2-1 cup of water and warm over stove top. Then mix the ingredients and repeat every 4 hours as needed. Please take Tylenol '500mg'$ -'650mg'$  every 6 hours for aches and pains, fevers. Hydrate very well with at least 2 liters of water. Eat light meals such as soups to replenish electrolytes and soft fruits, veggies. Start an antihistamine like Zyrtec ('10mg'$  daily) for postnasal drainage, sinus congestion.  You can take this together with Atrovent nasal spray.  Use the cough medications as needed.

## 2022-04-13 NOTE — ED Provider Notes (Signed)
[facility]  Note:  This document was prepared using Systems analyst and may include unintentional dictation errors.  MRN: [id number] DOB: 06/09/1980  Subjective:   Chelsea Andersen is a 42 y.o. female presenting for 3-day history of sinus congestion, chest congestion, low-grade fever, productive cough.  No overt chest pain, shortness of breath or wheezing.  No history of respiratory disorders.  She does have allergic rhinitis.  Has been using Tylenol, Advil, Zyrtec, Flonase and Zarbee's.  Reports that she normally has an elevated pulse when she is anxious especially in clinical settings.  No current facility-administered medications for this encounter.  Current Outpatient Medications:    cetirizine (ZYRTEC) 10 MG tablet, Take by mouth., Disp: , Rfl:    EPINEPHrine 0.3 mg/0.3 mL IJ SOAJ injection, Inject 0.3 mLs (0.3 mg total) into the muscle as needed for anaphylaxis., Disp: 1 each, Rfl: 0   FLUoxetine (PROZAC) 10 MG capsule, Take 1 capsule by mouth once daily, Disp: 90 capsule, Rfl: 0   ibuprofen (ADVIL,MOTRIN) 100 MG tablet, Take 100 mg by mouth every 6 (six) hours as needed for fever., Disp: , Rfl:    No Known Allergies  Past Medical History:  Diagnosis Date   Anemia    Anxiety    Cataracts, bilateral    congenital   Preeclampsia    1st pregnancy     Past Surgical History:  Procedure Laterality Date   CHOLECYSTECTOMY      Family History  Problem Relation Age of Onset   Hypertension Father    Hypercholesterolemia Father    Breast cancer Maternal Grandmother    Hypercholesterolemia Paternal Grandmother    Hypercholesterolemia Paternal Grandfather     Social History   Tobacco Use   Smoking status: Former    Packs/day: 1.50    Types: Cigarettes    Quit date: 05/02/2000    Years since quitting: 21.9   Smokeless tobacco: Never  Vaping Use   Vaping Use: Never used  Substance Use Topics   Alcohol use: No     Alcohol/week: 0.0 standard drinks of alcohol   Drug use: No    ROS   Objective:   Vitals: There were no vitals taken for this visit.  Physical Exam Constitutional:      General: She is not in acute distress.    Appearance: Normal appearance. She is well-developed and normal weight. She is not ill-appearing, toxic-appearing or diaphoretic.  HENT:     Head: Normocephalic and atraumatic.     Right Ear: Tympanic membrane, ear canal and external ear normal. No drainage or tenderness. No middle ear effusion. There is no impacted cerumen. Tympanic membrane is not erythematous or bulging.     Left Ear: Tympanic membrane, ear canal and external ear normal. No drainage or tenderness.  No middle ear effusion. There is no impacted cerumen. Tympanic membrane is not erythematous or bulging.     Nose: Congestion present. No rhinorrhea.     Mouth/Throat:     Mouth: Mucous membranes are moist. No oral lesions.     Pharynx: No pharyngeal swelling, oropharyngeal exudate, posterior oropharyngeal erythema or uvula swelling.     Tonsils: No tonsillar exudate or tonsillar abscesses.  Eyes:     General: No scleral icterus.       Right eye: No discharge.        Left eye: No discharge.     Extraocular Movements: Extraocular movements intact.     Right eye: Normal extraocular  motion.     Left eye: Normal extraocular motion.     Conjunctiva/sclera: Conjunctivae normal.  Cardiovascular:     Rate and Rhythm: Regular rhythm. Tachycardia present.     Heart sounds: Normal heart sounds. No murmur heard.    No friction rub. No gallop.  Pulmonary:     Effort: Pulmonary effort is normal. No respiratory distress.     Breath sounds: No stridor. No wheezing, rhonchi or rales.  Chest:     Chest wall: No tenderness.  Musculoskeletal:     Cervical back: Normal range of motion and neck supple.  Lymphadenopathy:     Cervical: No cervical adenopathy.  Skin:    General: Skin is warm and dry.  Neurological:      General: No focal deficit present.     Mental Status: She is alert and oriented to person, place, and time.  Psychiatric:        Mood and Affect: Mood normal.        Behavior: Behavior normal.     Assessment and Plan :   PDMP not reviewed this encounter.  1. Acute viral syndrome   2. Allergic rhinitis, unspecified seasonality, unspecified trigger   3. Tachycardia, unspecified     Deferred imaging given clear pulmonary exam, hemodynamically stable vital signs. Will manage for viral illness such as viral URI, viral syndrome, viral rhinitis, COVID-19. Recommended supportive care. Offered scripts for symptomatic relief. Testing is pending. Counseled patient on potential for adverse effects with medications prescribed/recommended today, ER and return-to-clinic precautions discussed, patient verbalized understanding.   Patient should undergo Paxlovid treatment should she test positive for COVID-19.   Jaynee Eagles, [place] 04/13/22 [phone]

## 2022-04-15 LAB — SARS CORONAVIRUS 2 (TAT 6-24 HRS): SARS Coronavirus 2: NEGATIVE

## 2022-04-16 ENCOUNTER — Other Ambulatory Visit: Payer: 59

## 2022-04-26 ENCOUNTER — Other Ambulatory Visit: Payer: 59

## 2022-05-13 ENCOUNTER — Other Ambulatory Visit: Payer: 59

## 2022-05-27 ENCOUNTER — Other Ambulatory Visit: Payer: 59

## 2022-05-29 ENCOUNTER — Encounter: Payer: Self-pay | Admitting: Family Medicine

## 2022-05-29 MED ORDER — FLUOXETINE HCL 10 MG PO CAPS: ORAL_CAPSULE | ORAL | 0 refills | Status: DC

## 2022-05-31 ENCOUNTER — Other Ambulatory Visit: Payer: 59

## 2022-06-07 ENCOUNTER — Other Ambulatory Visit: Payer: 59

## 2022-06-27 ENCOUNTER — Other Ambulatory Visit: Payer: Self-pay | Admitting: Family Medicine

## 2022-07-05 ENCOUNTER — Other Ambulatory Visit: Payer: 59

## 2022-07-12 ENCOUNTER — Other Ambulatory Visit: Payer: 59

## 2022-07-26 ENCOUNTER — Other Ambulatory Visit: Payer: 59

## 2022-11-29 ENCOUNTER — Encounter: Payer: 59 | Admitting: Obstetrics and Gynecology

## 2022-12-26 ENCOUNTER — Other Ambulatory Visit: Payer: Self-pay | Admitting: Family Medicine

## 2023-01-24 ENCOUNTER — Other Ambulatory Visit: Payer: Self-pay | Admitting: Family Medicine

## 2023-02-25 ENCOUNTER — Other Ambulatory Visit (HOSPITAL_COMMUNITY)
Admission: RE | Admit: 2023-02-25 | Discharge: 2023-02-25 | Disposition: A | Discharge status: Home / Self Care | Payer: 59 | Source: Ambulatory Visit

## 2023-02-25 ENCOUNTER — Ambulatory Visit (INDEPENDENT_AMBULATORY_CARE_PROVIDER_SITE_OTHER): Payer: 59

## 2023-02-25 VITALS — BP 127/63 | HR 98 | Wt 186.1 lb

## 2023-02-25 DIAGNOSIS — Z124 Encounter for screening for malignant neoplasm of cervix: Secondary | ICD-10-CM | POA: Insufficient documentation

## 2023-02-25 DIAGNOSIS — Z1151 Encounter for screening for human papillomavirus (HPV): Secondary | ICD-10-CM

## 2023-02-25 DIAGNOSIS — Z1239 Encounter for other screening for malignant neoplasm of breast: Secondary | ICD-10-CM

## 2023-02-25 DIAGNOSIS — Z01419 Encounter for gynecological examination (general) (routine) without abnormal findings: Secondary | ICD-10-CM | POA: Diagnosis not present

## 2023-02-25 NOTE — Progress Notes (Signed)
 GYNECOLOGY OFFICE VISIT NOTE-WELL WOMAN EXAM  History:   Chelsea Andersen is a 43 year old here today for well woman exam. She is amenorrheic d/t Mirena  IUD. She reports history of ovarian cysts that was noted with previous Mirena , but has not had issues with this one since placement.   Birth Control:  IUD-Mirena , States she is happy with method.  Reports it is suppose to be removed next year.   Reproductive Concerns Sexually Active: Yes. No pain or discomfort. Partners Type: Husband Number of partners in last year: One STD Testing: Declines  Obstetrical History: [id number] ; Vaginally-Complicated by Postpartum PreEclampsia in 1st pregnancy and Polyhydramnios in 2nd pregnancy. Gynecological History: No history of surgery or abnormal pap.  Unsure of last pap.  Vaginal/GU Concerns: No concerns. She denies any abnormal vaginal discharge, bleeding, or pelvic pain.  Breast Concerns/Exams: No concerns. She reports having mammogram at previous office. She reports checking  She endorses family history of breast in maternal grandmother (died at 40 after diagnosis) and paternal aunt (living). Paternal grandmother with ovarian cancer. No other significant family history breast, uterine, cervical, or ovarian cancer.  Medical and Nutrition PCP: Almarie Birmingham: Hasn't seen in a little while. Epic shows last appt 03/27/2022 Significant PMx: Anxiety, Previous taking Prozac  10mg  for the past 2 years, but recently discontinued 4 months ago.  States she is coping well.  Exercise: Every other day. Uses elliptical for ~ 20 minutes. Goal is to do 30 minutes.    Tobacco/Drugs/Alcohol/Vaping: Occasional ETOH-Usually during vacation.  Nutrition: [facility] and reports she does low carb as a support for husband who is DMA2. Reports she has lost 10lbs.  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   Health Maintenance: Pap: Unsure. Collected today.  Mammogram: Completed last year.  Colonoscopy: Plan to complete EOY or early next year Review of Systems:  Pertinent items noted in HPI and remainder of comprehensive ROS otherwise negative.    Objective:    Physical Exam BP 127/63   Pulse 98   Wt 186 lb 1.6 oz (84.4 kg)   BMI 30.97 kg/m   Vitals:   02/25/23 0920 02/25/23 0933  BP: (!) 156/73 127/63  Pulse: (!) 113 98  Weight: 186 lb 1.6 oz (84.4 kg)   *Reports bp is normally elevated at initial check, but usually normalizes. Endorses family history of HTN.   Physical Exam Vitals reviewed. Exam conducted with a chaperone present [place], CHARITY FUNDRAISER).  Constitutional:      General: She is not in acute distress.    Appearance: Normal appearance.  HENT:     Head: Normocephalic and atraumatic.  Eyes:     Conjunctiva/sclera: Conjunctivae normal.  Cardiovascular:     Rate and Rhythm: Normal rate and regular rhythm.     Heart sounds: Normal heart sounds.  Pulmonary:     Effort: Pulmonary effort is normal. No respiratory distress.     Breath sounds: Normal breath sounds.  Abdominal:     General: Bowel sounds  are normal. There is no distension.     Palpations: Abdomen is soft.     Tenderness: There is no abdominal tenderness.  Genitourinary:    Labia:        Right: No tenderness.        Left: No tenderness.      Vagina: Vaginal discharge (Scant amt white) present.     Cervix: Friability present.     Uterus: Not enlarged and not tender.      Comments: Cervix pink and without lesions, cysts, or polyp.  Blue IUD  strings noted ~ 2cm from os. Pap collected with broom and spatula. Friable after collection.  BME performed with no apparent uterine enlargement or tenderness. Adnexa not appreciated. Musculoskeletal:        General: Normal range of motion.     Cervical back: Normal range of motion.  Skin:    General: Skin is warm and dry.  Neurological:     Mental Status: She is alert and oriented to person, place, and time.  Psychiatric:        Mood and Affect: Mood normal.        Behavior: Behavior normal.      Labs and Imaging No results found for this or any previous visit (from the past week). No results found.   Assessment & Plan:  43 year old Female Well Woman Exam Pap Smear Breast Exam IUD in Place  1. Encounter for well woman exam with routine gynecological exam (Primary) -Exam performed and findings discussed. -Encouraged to utilize Mychart for reviewing of results, communication with office, and scheduling of appts. -Educated on AHA exercise recommendations of 30 minutes of moderate to vigorous activity at least 5x/week. Encouraged to take on strength training.   2. Pap smear for cervical cancer screening -Pap collected today. -Will obtain previous records for review.  -Educated on ASCCP guidelines regarding pap smear evaluation and frequency. -Informed of turnover time and provider/clinic policy on releasing results.  3. Encounter for screening breast examination -Educated and encouraged to continue SBE. -Will obtain previous records and determining timing of next mammogram.  Routine preventative health maintenance measures emphasized. Please refer to After Visit Summary for other counseling recommendations.    Harlene LITTIE Duncans, CNM 02/25/2023

## 2023-02-25 NOTE — Addendum Note (Signed)
 Addended by: Leola Brazil on: 02/25/2023 02:09 PM   Modules accepted: Orders

## 2023-02-28 LAB — CYTOLOGY - PAP
Comment: NEGATIVE
Diagnosis: NEGATIVE
High risk HPV: NEGATIVE

## 2023-03-05 ENCOUNTER — Telehealth: Payer: Self-pay | Admitting: *Deleted

## 2023-03-05 NOTE — Telephone Encounter (Signed)
Pt is scheduled for 03-19-2023 with provider to then get orders for labs. Done

## 2023-03-05 NOTE — Telephone Encounter (Signed)
Pt was scheduled for lab appointment for 03/07/23.  There are old old orders in there from almost a year ago.  Do you think pt may need to be seen first? Pt last visit was 03/2022

## 2023-03-05 NOTE — Telephone Encounter (Signed)
Yes, will need normal appointment.   Can we contact patient to schedule follow up with Ladona Ridgel please?

## 2023-03-07 ENCOUNTER — Other Ambulatory Visit: Payer: 59

## 2023-03-18 DIAGNOSIS — F410 Panic disorder [episodic paroxysmal anxiety] without agoraphobia: Secondary | ICD-10-CM | POA: Insufficient documentation

## 2023-03-18 DIAGNOSIS — F411 Generalized anxiety disorder: Secondary | ICD-10-CM | POA: Insufficient documentation

## 2023-03-19 ENCOUNTER — Ambulatory Visit: Payer: 59 | Admitting: Family Medicine

## 2023-03-19 DIAGNOSIS — Z0000 Encounter for general adult medical examination without abnormal findings: Secondary | ICD-10-CM

## 2023-03-27 ENCOUNTER — Ambulatory Visit: Payer: 59 | Admitting: Family Medicine

## 2023-03-28 ENCOUNTER — Ambulatory Visit: Payer: 59 | Admitting: Family Medicine

## 2023-03-28 VITALS — BP 132/68 | HR 94 | Ht 65.0 in | Wt 183.0 lb

## 2023-03-28 DIAGNOSIS — Z0000 Encounter for general adult medical examination without abnormal findings: Secondary | ICD-10-CM | POA: Diagnosis not present

## 2023-03-28 DIAGNOSIS — E538 Deficiency of other specified B group vitamins: Secondary | ICD-10-CM

## 2023-03-28 LAB — CBC WITH DIFFERENTIAL/PLATELET
Basophils Absolute: 0 10*3/uL (ref 0.0–0.1)
Basophils Relative: 0.5 % (ref 0.0–3.0)
Eosinophils Absolute: 0.1 10*3/uL (ref 0.0–0.7)
Eosinophils Relative: 1.5 % (ref 0.0–5.0)
HCT: 39.3 % (ref 36.0–46.0)
Hemoglobin: 13 g/dL (ref 12.0–15.0)
Lymphocytes Relative: 39.4 % (ref 12.0–46.0)
Lymphs Abs: 2.8 10*3/uL (ref 0.7–4.0)
MCHC: 33.2 g/dL (ref 30.0–36.0)
MCV: 87.3 fL (ref 78.0–100.0)
Monocytes Absolute: 0.5 10*3/uL (ref 0.1–1.0)
Monocytes Relative: 6.9 % (ref 3.0–12.0)
Neutro Abs: 3.7 10*3/uL (ref 1.4–7.7)
Neutrophils Relative %: 51.7 % (ref 43.0–77.0)
Platelets: 327 10*3/uL (ref 150.0–400.0)
RBC: 4.5 Mil/uL (ref 3.87–5.11)
RDW: 12.9 % (ref 11.5–15.5)
WBC: 7.1 10*3/uL (ref 4.0–10.5)

## 2023-03-28 LAB — VITAMIN B12: Vitamin B-12: 525 pg/mL (ref 211–911)

## 2023-03-28 LAB — COMPREHENSIVE METABOLIC PANEL
ALT: 14 U/L (ref 0–35)
AST: 15 U/L (ref 0–37)
Albumin: 4.8 g/dL (ref 3.5–5.2)
Alkaline Phosphatase: 70 U/L (ref 39–117)
BUN: 14 mg/dL (ref 6–23)
CO2: 25 meq/L (ref 19–32)
Calcium: 9.5 mg/dL (ref 8.4–10.5)
Chloride: 105 meq/L (ref 96–112)
Creatinine, Ser: 0.61 mg/dL (ref 0.40–1.20)
GFR: 108.8 mL/min (ref >60.00)
Glucose, Bld: 91 mg/dL (ref 70–99)
Potassium: 4.3 meq/L (ref 3.5–5.1)
Sodium: 139 meq/L (ref 135–145)
Total Bilirubin: 0.4 mg/dL (ref 0.2–1.2)
Total Protein: 7.4 g/dL (ref 6.0–8.3)

## 2023-03-28 LAB — LIPID PANEL
Cholesterol: 178 mg/dL (ref 0–200)
HDL: 54.1 mg/dL (ref >39.00)
LDL Cholesterol: 99 mg/dL (ref 0–99)
NonHDL: 123.95
Total CHOL/HDL Ratio: 3
Triglycerides: 126 mg/dL (ref 0.0–149.0)
VLDL: 25.2 mg/dL (ref 0.0–40.0)

## 2023-03-28 LAB — TSH: TSH: 2.86 u[IU]/mL (ref 0.35–5.50)

## 2023-03-28 NOTE — Progress Notes (Signed)
 Complete physical exam  Patient: Chelsea Andersen   DOB: [birth year]   43 y.o. Female  MRN: [id number]  Subjective:    Chief Complaint  Patient presents with   Annual Exam    CERITA RABELO is a 43 y.o. female who presents today for a complete physical exam. She reports consuming a general and low-carb  diet. The patient does not participate in regular exercise at present. She generally feels well. She reports sleeping well. She does not have additional problems to discuss today.   Currently lives with: family  Acute concerns or interim problems since last visit: no - she has been doing really well overall. Stopped her Prozac and doing great without it.   Vision concerns: no Dental concerns: no STD concerns: no  ETOH use: no Nicotine use: no Recreational drugs/illegal substances: no  Females:  She is currently  sexually active  Contraception choices are: IUD LMP: 2 years ago - IUD   Wt Readings from Last 3 Encounters:  03/28/23 183 lb (83 kg)  02/25/23 186 lb 1.6 oz (84.4 kg)  03/27/22 188 lb (85.3 kg)      Most recent fall risk assessment:    03/28/2023   12:17 PM  Fall Risk   Falls in the past year? 1  Number falls in past yr: 0  Injury with Fall? 0  Risk for fall due to : No Fall Risks  Follow up Falls evaluation completed     Most recent depression screenings:    03/28/2023   12:17 PM 03/27/2022    4:00 PM  PHQ 2/9 Scores  PHQ - 2 Score 0 0  PHQ- 9 Score 0 2      03/28/2023   12:18 PM 03/27/2022    4:01 PM  GAD 7 : Generalized Anxiety Score  Nervous, Anxious, on Edge 1 1  Control/stop worrying 0 1  Worry too much - different things 0 1  Trouble relaxing 0 1  Restless 0 0  Easily annoyed or irritable 0 0  Afraid - awful might happen 1 1  Total GAD 7 Score 2 5  Anxiety Difficulty Not difficult at all Somewhat difficult           Patient Care Team: Clayborne Dana, NP as PCP - General (Family Medicine)   Outpatient Medications  Prior to Visit  Medication Sig   cetirizine (ZYRTEC) 10 MG tablet Take by mouth.   EPINEPHrine 0.3 mg/0.3 mL IJ SOAJ injection Inject 0.3 mLs (0.3 mg total) into the muscle as needed for anaphylaxis. (Patient not taking: Reported on 03/28/2023)   No facility-administered medications prior to visit.    ROS All review of systems negative except what is listed in the HPI        Objective:     BP 132/68   Pulse 94   Ht 5\' 5"  (1.651 m)   Wt 183 lb (83 kg)   SpO2 100%   BMI 30.45 kg/m    Physical Exam Vitals reviewed.  Constitutional:      General: She is not in acute distress.    Appearance: Normal appearance. She is obese. She is not ill-appearing.  HENT:     Head: Normocephalic and atraumatic.     Right Ear: Tympanic membrane normal.     Left Ear: Tympanic membrane normal.     Nose: Nose normal.     Mouth/Throat:     Mouth: Mucous membranes are moist.     Pharynx: Oropharynx is  clear.  Eyes:     Extraocular Movements: Extraocular movements intact.     Conjunctiva/sclera: Conjunctivae normal.     Pupils: Pupils are equal, round, and reactive to light.  Cardiovascular:     Rate and Rhythm: Normal rate and regular rhythm.     Pulses: Normal pulses.     Heart sounds: Normal heart sounds.  Pulmonary:     Effort: Pulmonary effort is normal.     Breath sounds: Normal breath sounds.  Abdominal:     General: Abdomen is flat. Bowel sounds are normal. There is no distension.     Palpations: Abdomen is soft. There is no mass.     Tenderness: There is no abdominal tenderness. There is no right CVA tenderness, left CVA tenderness, guarding or rebound.  Genitourinary:    Comments: Deferred exam Musculoskeletal:        General: Normal range of motion.     Cervical back: Normal range of motion and neck supple. No tenderness.     Right lower leg: No edema.     Left lower leg: No edema.  Lymphadenopathy:     Cervical: No cervical adenopathy.  Skin:    General: Skin is warm  and dry.     Capillary Refill: Capillary refill takes less than 2 seconds.  Neurological:     General: No focal deficit present.     Mental Status: She is alert and oriented to person, place, and time. Mental status is at baseline.  Psychiatric:        Mood and Affect: Mood normal.        Behavior: Behavior normal.        Thought Content: Thought content normal.        Judgment: Judgment normal.      No results found for any visits on 03/28/23.     Assessment & Plan:    Routine Health Maintenance and Physical Exam Discussed health promotion and safety including diet and exercise recommendations, dental health, and injury prevention. Tobacco cessation if applicable. Seat belts, sunscreen, smoke detectors, etc.    Immunization History  Administered Date(s) Administered   DTaP 02/11/1979, 05/26/1979, 08/10/1979, 02/17/1980   Fluzone Influenza virus vaccine,trivalent (IIV3), split virus 11/11/2017   Influenza,inj,Quad PF,6+ Mos 11/12/2021   Influenza-Unspecified 11/21/2022   MMR 07/29/1980, 10/05/2020   Moderna Sars-Covid-2 Vaccination 01/12/2020, 02/09/2020   OPV 02/11/1979, 05/26/1979, 08/10/1979, 02/17/1980   Tdap 09/29/2020    Health Maintenance  Topic Date Due   HIV Screening  Never done   Hepatitis C Screening  Never done   COVID-19 Vaccine (3 - 2024-25 season) 03/17/2024 (Originally 10/13/2022)   Cervical Cancer Screening (HPV/Pap Cotest)  02/25/2028   DTaP/Tdap/Td (6 - Td or Tdap) 09/30/2030   INFLUENZA VACCINE  Completed   HPV VACCINES  Aged Out        Problem List Items Addressed This Visit   None Visit Diagnoses       Annual physical exam    -  Primary   Relevant Orders   CBC with Differential/Platelet   Comprehensive metabolic panel   Lipid panel   TSH     B12 deficiency       Relevant Orders   Vitamin B12        PATIENT COUNSELING:     Sexuality: Discussed sexually transmitted diseases, partner selection, use of condoms, avoidance of  unintended pregnancy, and contraceptive alternatives.    I discussed with the patient that most people either abstain from alcohol or drink  within safe limits (<=14/week and <=4 drinks/occasion for males, <=7/weeks and <= 3 drinks/occasion for females) and that the risk for alcohol disorders and other health effects rises proportionally with the number of drinks per week and how often a drinker exceeds daily limits.  Discussed cessation/primary prevention of drug use and availability of treatment for abuse.   Diet: Encouraged to adjust caloric intake to maintain or achieve ideal body weight, to reduce intake of dietary saturated fat and total fat, to limit sodium intake by avoiding high sodium foods and not adding table salt, and to maintain adequate dietary potassium and calcium preferably from fresh fruits, vegetables, and low-fat dairy products. Encouraged vitamin D 1000 units and Calcium 1300mg  or 4 servings of dairy a day.  Emphasized the importance of regular exercise.  Injury prevention: Discussed safety belts, safety helmets, smoke detector, smoking near bedding or upholstery.   Dental health: Discussed importance of regular tooth brushing, flossing, and dental visits.       Return in about 1 year (around 03/27/2024) for physical.     Clayborne Dana, NP

## 2023-03-28 NOTE — Patient Instructions (Signed)
Preventive Care 16-45 Years Old, Female  Preventive care refers to lifestyle choices and visits with your health care provider that can promote health and wellness. Preventive care visits are also called wellness exams.  What can I expect for my preventive care visit?  Counseling  Your health care provider may ask you questions about your:  Medical history, including:  Past medical problems.  Family medical history.  Pregnancy history.  Current health, including:  Menstrual cycle.  Method of birth control.  Emotional well-being.  Home life and relationship well-being.  Sexual activity and sexual health.  Lifestyle, including:  Alcohol, nicotine or tobacco, and drug use.  Access to firearms.  Diet, exercise, and sleep habits.  Work and work Astronomer.  Sunscreen use.  Safety issues such as seatbelt and bike helmet use.  Physical exam  Your health care provider will check your:  Height and weight. These may be used to calculate your BMI (body mass index). BMI is a measurement that tells if you are at a healthy weight.  Waist circumference. This measures the distance around your waistline. This measurement also tells if you are at a healthy weight and may help predict your risk of certain diseases, such as type 2 diabetes and high blood pressure.  Heart rate and blood pressure.  Body temperature.  Skin for abnormal spots.  What immunizations do I need?    Vaccines are usually given at various ages, according to a schedule. Your health care provider will recommend vaccines for you based on your age, medical history, and lifestyle or other factors, such as travel or where you work.  What tests do I need?  Screening  Your health care provider may recommend screening tests for certain conditions. This may include:  Lipid and cholesterol levels.  Diabetes screening. This is done by checking your blood sugar (glucose) after you have not eaten for a while (fasting).  Pelvic exam and Pap test.  Hepatitis B test.  Hepatitis C  test.  HIV (human immunodeficiency virus) test.  STI (sexually transmitted infection) testing, if you are at risk.  Lung cancer screening.  Colorectal cancer screening.  Mammogram. Talk with your health care provider about when you should start having regular mammograms. This may depend on whether you have a family history of breast cancer.  BRCA-related cancer screening. This may be done if you have a family history of breast, ovarian, tubal, or peritoneal cancers.  Bone density scan. This is done to screen for osteoporosis.  Talk with your health care provider about your test results, treatment options, and if necessary, the need for more tests.  Follow these instructions at home:  Eating and drinking    Eat a diet that includes fresh fruits and vegetables, whole grains, lean protein, and low-fat dairy products.  Take vitamin and mineral supplements as recommended by your health care provider.  Do not drink alcohol if:  Your health care provider tells you not to drink.  You are pregnant, may be pregnant, or are planning to become pregnant.  If you drink alcohol:  Limit how much you have to 0-1 drink a day.  Know how much alcohol is in your drink. In the [place] one drink equals one 12 oz bottle of beer (355 mL), one 5 oz glass of wine (148 mL), or one 1 oz glass of hard liquor (44 mL).  Lifestyle  Brush your teeth every morning and night with fluoride toothpaste. Floss one time each day.  Exercise for at least  30 minutes 5 or more days each week.  Do not use any products that contain nicotine or tobacco. These products include cigarettes, chewing tobacco, and vaping devices, such as e-cigarettes. If you need help quitting, ask your health care provider.  Do not use drugs.  If you are sexually active, practice safe sex. Use a condom or other form of protection to prevent STIs.  If you do not wish to become pregnant, use a form of birth control. If you plan to become pregnant, see your health care provider for a  prepregnancy visit.  Take aspirin only as told by your health care provider. Make sure that you understand how much to take and what form to take. Work with your health care provider to find out whether it is safe and beneficial for you to take aspirin daily.  Find healthy ways to manage stress, such as:  Meditation, yoga, or listening to music.  Journaling.  Talking to a trusted person.  Spending time with friends and family.  Minimize exposure to UV radiation to reduce your risk of skin cancer.  Safety  Always wear your seat belt while driving or riding in a vehicle.  Do not drive:  If you have been drinking alcohol. Do not ride with someone who has been drinking.  When you are tired or distracted.  While texting.  If you have been using any mind-altering substances or drugs.  Wear a helmet and other protective equipment during sports activities.  If you have firearms in your house, make sure you follow all gun safety procedures.  Seek help if you have been physically or sexually abused.  What's next?  Visit your health care provider once a year for an annual wellness visit.  Ask your health care provider how often you should have your eyes and teeth checked.  Stay up to date on all vaccines.  This information is not intended to replace advice given to you by your health care provider. Make sure you discuss any questions you have with your health care provider.  Document Revised: 07/26/2020 Document Reviewed: 07/26/2020  Elsevier Patient Education  2024 [employer].

## 2023-03-31 ENCOUNTER — Encounter: Payer: Self-pay | Admitting: Family Medicine

## 2023-04-04 ENCOUNTER — Other Ambulatory Visit: Payer: Self-pay

## 2023-04-04 DIAGNOSIS — Z1239 Encounter for other screening for malignant neoplasm of breast: Secondary | ICD-10-CM

## 2023-04-07 ENCOUNTER — Telehealth: Payer: Self-pay

## 2023-04-07 NOTE — Telephone Encounter (Signed)
 Called patient to find out where she wants to have her mammogram. Left message for patient to call the office back. Bernhard Koskinen l Olivia Pavelko, CMA

## 2023-04-07 NOTE — Telephone Encounter (Signed)
-----   Message from Cherre Robins sent at 04/05/2023  6:17 AM EST ----- Regarding: Mammogram Can someone please call Chelsea Andersen and see where she would prefer to go for her mammogram.  Make note and I will place the order on Tuesday.  Thanks [employer],  Ashkum

## 2023-04-14 ENCOUNTER — Inpatient Hospital Stay (HOSPITAL_BASED_OUTPATIENT_CLINIC_OR_DEPARTMENT_OTHER): Admission: RE | Admit: 2023-04-14 | Payer: 59 | Source: Ambulatory Visit

## 2023-04-17 ENCOUNTER — Ambulatory Visit (HOSPITAL_BASED_OUTPATIENT_CLINIC_OR_DEPARTMENT_OTHER)
Admission: RE | Admit: 2023-04-17 | Discharge: 2023-04-17 | Disposition: A | Discharge status: Home / Self Care | Source: Ambulatory Visit

## 2023-04-17 ENCOUNTER — Encounter (HOSPITAL_BASED_OUTPATIENT_CLINIC_OR_DEPARTMENT_OTHER): Payer: Self-pay

## 2023-04-17 DIAGNOSIS — Z1231 Encounter for screening mammogram for malignant neoplasm of breast: Secondary | ICD-10-CM | POA: Diagnosis not present

## 2023-04-17 DIAGNOSIS — Z1239 Encounter for other screening for malignant neoplasm of breast: Secondary | ICD-10-CM

## 2023-04-30 ENCOUNTER — Encounter: Payer: Self-pay | Admitting: Family Medicine

## 2023-12-01 ENCOUNTER — Other Ambulatory Visit (HOSPITAL_BASED_OUTPATIENT_CLINIC_OR_DEPARTMENT_OTHER): Payer: Self-pay

## 2023-12-01 MED ORDER — FLUZONE 0.5 ML IM SUSY
0.5000 mL | PREFILLED_SYRINGE | Freq: Once | INTRAMUSCULAR | 0 refills | Status: AC
  Filled 2023-12-01: qty 0.5, 1d supply, fill #0

## 2024-02-14 ENCOUNTER — Telehealth: Admitting: Physician Assistant

## 2024-02-14 ENCOUNTER — Encounter

## 2024-02-14 DIAGNOSIS — J019 Acute sinusitis, unspecified: Secondary | ICD-10-CM | POA: Diagnosis not present

## 2024-02-14 DIAGNOSIS — B9689 Other specified bacterial agents as the cause of diseases classified elsewhere: Secondary | ICD-10-CM | POA: Diagnosis not present

## 2024-02-14 MED ORDER — AMOXICILLIN-POT CLAVULANATE 875-125 MG PO TABS: 1.0000 | ORAL_TABLET | Freq: Two times a day (BID) | ORAL | 0 refills | Status: AC

## 2024-02-14 NOTE — Progress Notes (Signed)
 " Virtual Visit Consent   Chelsea Andersen, you are scheduled for a virtual visit with a [facility] provider today. Just as with appointments in the office, your consent must be obtained to participate. Your consent will be active for this visit and any virtual visit you may have with one of our providers in the next 365 days. If you have a MyChart account, a copy of this consent can be sent to you electronically.  As this is a virtual visit, video technology does not allow for your provider to perform a traditional examination. This may limit your provider's ability to fully assess your condition. If your provider identifies any concerns that need to be evaluated in person or the need to arrange testing (such as labs, EKG, etc.), we will make arrangements to do so. Although advances in technology are sophisticated, we cannot ensure that it will always work on either your end or our end. If the connection with a video visit is poor, the visit may have to be switched to a telephone visit. With either a video or telephone visit, we are not always able to ensure that we have a secure connection.  By engaging in this virtual visit, you consent to the provision of healthcare and authorize for your insurance to be billed (if applicable) for the services provided during this visit. Depending on your insurance coverage, you may receive a charge related to this service.  I need to obtain your verbal consent now. Are you willing to proceed with your visit today? Chelsea Andersen has provided verbal consent on 02/14/2024 for a virtual visit (video or telephone). Chelsea Schifano, PA-C  Date: 02/14/2024 5:05 PM   Virtual Visit via Video Note   I, Chelsea Andersen, connected with  Chelsea Andersen  ([id number], [birth year]) on 02/14/2024 at  5:00 PM EST by a video-enabled telemedicine application and verified that I am speaking with the correct person using two identifiers.  Location: Patient: Virtual Visit Location  Patient: Home Provider: Virtual Visit Location Provider: Home Office   I discussed the limitations of evaluation and management by telemedicine and the availability of in person appointments. The patient expressed understanding and agreed to proceed.    History of Present Illness: Chelsea Andersen is a 44 y.o. who identifies as a female who was assigned female at birth, and is being seen today for sinus congestion.  HPI: Past medical history of allergies who presents for evaluation of sinus congestion going on for the last week with worsening symptoms since last night.  Patient reports nasal congestion, sinus pain and pressure with thick colored mucus.  She is now having a cough that is worse for Singh in the morning.  She currently taking her allergy medicine.  No recent sick contacts denies any fever, chills, nausea, vomiting, ear pain.    Problems:  Patient Active Problem List   Diagnosis Date Noted   Panic disorder 03/18/2023   Generalized anxiety disorder 03/18/2023   Anemia 03/27/2022   Anxiety 03/27/2022   Contraception, device intrauterine 03/27/2022   BMI 31.0-31.9,adult 03/27/2022   Allergic rhinitis due to animal dander 02/01/2016   Cataracts, bilateral 07/14/2015    Allergies: Allergies[1] Medications: Current Medications[2]  Observations/Objective: Patient is well-developed, well-nourished in no acute distress.  Resting comfortably  at home.  Head is normocephalic, atraumatic.  No labored breathing.  Speech is clear and coherent with logical content.  Nose: TTP to palpation over the maxillary sinus  Patient is alert and oriented at  baseline.    Assessment and Plan: 1. Acute bacterial sinusitis (Primary)  Get rest and adequate sleep  Drink plenty of water, broth, and other clear fluids to stay hydrated.  Use a cool-mist humidifier or take steamy showers to relieve congestion.  Elevate the head of the bed to help with post nasal drainage Sip warm liquids,  gargle with salt water, use lozenges, or suck on hard candy.  Use over-the-counter medications like acetaminophen (Tylenol) or ibuprofen (Advil, Motrin) as needed for fever and pain Honey cough drops can help alleviate cough symptoms.  Use saline nasal sprays or washes.  Over the counter mucinex, max strength ( blue and white box) to help loosen sinus congestion.  Please to the emergency room if any new or worsening symptoms  Please seek an in-person evaluation if the symptoms worsen or if the condition fails to improve as anticipated.  PCP follow-up in 5-7 days     Follow Up Instructions: I discussed the assessment and treatment plan with the patient. The patient was provided an opportunity to ask questions and all were answered. The patient agreed with the plan and demonstrated an understanding of the instructions.  A copy of instructions were sent to the patient via MyChart unless otherwise noted below.    The patient was advised to call back or seek an in-person evaluation if the symptoms worsen or if the condition fails to improve as anticipated.    Shaolin Armas, PA-C    [1]  Allergies Allergen Reactions   Dust Mite Extract Anaphylaxis  [2]  Current Outpatient Medications:    cetirizine (ZYRTEC) 10 MG tablet, Take by mouth., Disp: , Rfl:    EPINEPHrine  0.3 mg/0.3 mL IJ SOAJ injection, Inject 0.3 mLs (0.3 mg total) into the muscle as needed for anaphylaxis. (Patient not taking: Reported on 03/28/2023), Disp: 1 each, Rfl: 0  "

## 2024-02-14 NOTE — Patient Instructions (Signed)
" °  Ilah VEAR Clarke, thank you for joining Elon Lomeli, PA-C for today's virtual visit.  While this provider is not your primary care provider (PCP), if your PCP is located in our provider database this encounter information will be shared with them immediately following your visit.   A [facility] MyChart account gives you access to today's visit and all your visits, tests, and labs performed at [facility]  click here if you don't have a [facility] MyChart account or go to [email or web address]  Consent: (Patient) Ilah VEAR Clarke provided verbal consent for this virtual visit at the beginning of the encounter.  Current Medications:  Current Outpatient Medications:    cetirizine (ZYRTEC) 10 MG tablet, Take by mouth., Disp: , Rfl:    EPINEPHrine  0.3 mg/0.3 mL IJ SOAJ injection, Inject 0.3 mLs (0.3 mg total) into the muscle as needed for anaphylaxis. (Patient not taking: Reported on 03/28/2023), Disp: 1 each, Rfl: 0   Medications ordered in this encounter:  No orders of the defined types were placed in this encounter.    *If you need refills on other medications prior to your next appointment, please contact your pharmacy*  Follow-Up: Call back or seek an in-person evaluation if the symptoms worsen or if the condition fails to improve as anticipated.  [facility] [phone]  Other Instructions    If you have been instructed to have an in-person evaluation today at a local Urgent Care facility, please use the link below. It will take you to a list of all of our available [facility], including address, phone number and hours of operation. Please do not delay care.  [facility]  If you or a family member do not have a primary care provider, use the link below to schedule a visit and establish care. When you choose a [facility] primary care physician or advanced practice provider, you gain a long-term partner in  health. Find a Primary Care Provider  Learn more about [facility] in-office and virtual care options: [facility] Get Care Now  "

## 2024-03-23 ENCOUNTER — Ambulatory Visit
